# Patient Record
Sex: Female | Born: 1940 | Smoking: Never smoker
Health system: Southern US, Community
[De-identification: ages and names within clinical notes are randomized; demographics above are authoritative.]

## PROBLEM LIST (undated history)

## (undated) DIAGNOSIS — I729 Aneurysm of unspecified site: Secondary | ICD-10-CM

## (undated) DIAGNOSIS — I10 Essential (primary) hypertension: Secondary | ICD-10-CM

## (undated) DIAGNOSIS — I609 Nontraumatic subarachnoid hemorrhage, unspecified: Secondary | ICD-10-CM

## (undated) HISTORY — PX: TONSILLECTOMY: SUR1361

---

## 2015-09-10 DIAGNOSIS — K8020 Calculus of gallbladder without cholecystitis without obstruction: Secondary | ICD-10-CM | POA: Insufficient documentation

## 2017-11-24 DIAGNOSIS — E876 Hypokalemia: Secondary | ICD-10-CM | POA: Insufficient documentation

## 2017-11-24 DIAGNOSIS — I671 Cerebral aneurysm, nonruptured: Secondary | ICD-10-CM | POA: Insufficient documentation

## 2017-12-14 DIAGNOSIS — R0609 Other forms of dyspnea: Secondary | ICD-10-CM

## 2017-12-14 DIAGNOSIS — R5382 Chronic fatigue, unspecified: Secondary | ICD-10-CM | POA: Insufficient documentation

## 2017-12-14 DIAGNOSIS — R6889 Other general symptoms and signs: Secondary | ICD-10-CM | POA: Insufficient documentation

## 2018-04-02 DIAGNOSIS — F419 Anxiety disorder, unspecified: Secondary | ICD-10-CM | POA: Insufficient documentation

## 2018-04-02 DIAGNOSIS — M810 Age-related osteoporosis without current pathological fracture: Secondary | ICD-10-CM | POA: Insufficient documentation

## 2018-04-02 DIAGNOSIS — R413 Other amnesia: Secondary | ICD-10-CM | POA: Diagnosis not present

## 2018-04-02 DIAGNOSIS — H109 Unspecified conjunctivitis: Secondary | ICD-10-CM | POA: Diagnosis not present

## 2018-04-02 DIAGNOSIS — R319 Hematuria, unspecified: Secondary | ICD-10-CM | POA: Insufficient documentation

## 2018-04-02 DIAGNOSIS — I671 Cerebral aneurysm, nonruptured: Secondary | ICD-10-CM | POA: Diagnosis not present

## 2018-04-02 DIAGNOSIS — R7302 Impaired glucose tolerance (oral): Secondary | ICD-10-CM | POA: Diagnosis not present

## 2018-04-02 DIAGNOSIS — I10 Essential (primary) hypertension: Secondary | ICD-10-CM | POA: Insufficient documentation

## 2018-04-02 DIAGNOSIS — D519 Vitamin B12 deficiency anemia, unspecified: Secondary | ICD-10-CM | POA: Insufficient documentation

## 2018-04-08 DIAGNOSIS — D519 Vitamin B12 deficiency anemia, unspecified: Secondary | ICD-10-CM | POA: Diagnosis not present

## 2018-04-08 DIAGNOSIS — R7302 Impaired glucose tolerance (oral): Secondary | ICD-10-CM | POA: Diagnosis not present

## 2018-04-08 DIAGNOSIS — I10 Essential (primary) hypertension: Secondary | ICD-10-CM | POA: Diagnosis not present

## 2018-04-14 DIAGNOSIS — R5383 Other fatigue: Secondary | ICD-10-CM | POA: Diagnosis not present

## 2018-04-14 DIAGNOSIS — F329 Major depressive disorder, single episode, unspecified: Secondary | ICD-10-CM | POA: Diagnosis not present

## 2018-04-14 DIAGNOSIS — Z7409 Other reduced mobility: Secondary | ICD-10-CM | POA: Diagnosis not present

## 2018-04-14 DIAGNOSIS — H8113 Benign paroxysmal vertigo, bilateral: Secondary | ICD-10-CM | POA: Diagnosis not present

## 2018-04-14 DIAGNOSIS — R5381 Other malaise: Secondary | ICD-10-CM | POA: Diagnosis not present

## 2018-04-14 DIAGNOSIS — W19XXXA Unspecified fall, initial encounter: Secondary | ICD-10-CM | POA: Diagnosis not present

## 2018-04-14 DIAGNOSIS — Z789 Other specified health status: Secondary | ICD-10-CM | POA: Insufficient documentation

## 2018-04-14 DIAGNOSIS — R42 Dizziness and giddiness: Secondary | ICD-10-CM | POA: Diagnosis not present

## 2018-04-14 DIAGNOSIS — R6889 Other general symptoms and signs: Secondary | ICD-10-CM | POA: Diagnosis not present

## 2018-04-14 DIAGNOSIS — R5382 Chronic fatigue, unspecified: Secondary | ICD-10-CM | POA: Diagnosis not present

## 2018-04-30 DIAGNOSIS — Z79899 Other long term (current) drug therapy: Secondary | ICD-10-CM | POA: Diagnosis not present

## 2018-04-30 DIAGNOSIS — D519 Vitamin B12 deficiency anemia, unspecified: Secondary | ICD-10-CM | POA: Diagnosis not present

## 2018-04-30 DIAGNOSIS — Z823 Family history of stroke: Secondary | ICD-10-CM | POA: Diagnosis not present

## 2018-04-30 DIAGNOSIS — R5382 Chronic fatigue, unspecified: Secondary | ICD-10-CM | POA: Diagnosis not present

## 2018-04-30 DIAGNOSIS — I10 Essential (primary) hypertension: Secondary | ICD-10-CM | POA: Diagnosis not present

## 2018-04-30 DIAGNOSIS — M899 Disorder of bone, unspecified: Secondary | ICD-10-CM | POA: Diagnosis not present

## 2018-04-30 DIAGNOSIS — R4182 Altered mental status, unspecified: Secondary | ICD-10-CM | POA: Diagnosis not present

## 2018-04-30 DIAGNOSIS — I361 Nonrheumatic tricuspid (valve) insufficiency: Secondary | ICD-10-CM | POA: Diagnosis not present

## 2018-04-30 DIAGNOSIS — R9431 Abnormal electrocardiogram [ECG] [EKG]: Secondary | ICD-10-CM | POA: Diagnosis not present

## 2018-04-30 DIAGNOSIS — Z7982 Long term (current) use of aspirin: Secondary | ICD-10-CM | POA: Diagnosis not present

## 2018-04-30 DIAGNOSIS — W19XXXA Unspecified fall, initial encounter: Secondary | ICD-10-CM | POA: Diagnosis not present

## 2018-04-30 DIAGNOSIS — M25512 Pain in left shoulder: Secondary | ICD-10-CM | POA: Diagnosis not present

## 2018-04-30 DIAGNOSIS — Z888 Allergy status to other drugs, medicaments and biological substances status: Secondary | ICD-10-CM | POA: Diagnosis not present

## 2018-04-30 DIAGNOSIS — I671 Cerebral aneurysm, nonruptured: Secondary | ICD-10-CM | POA: Diagnosis not present

## 2018-04-30 DIAGNOSIS — E785 Hyperlipidemia, unspecified: Secondary | ICD-10-CM | POA: Diagnosis not present

## 2018-04-30 DIAGNOSIS — Z8249 Family history of ischemic heart disease and other diseases of the circulatory system: Secondary | ICD-10-CM | POA: Diagnosis not present

## 2018-04-30 DIAGNOSIS — R930 Abnormal findings on diagnostic imaging of skull and head, not elsewhere classified: Secondary | ICD-10-CM | POA: Diagnosis not present

## 2018-04-30 DIAGNOSIS — Y998 Other external cause status: Secondary | ICD-10-CM | POA: Diagnosis not present

## 2018-04-30 DIAGNOSIS — F410 Panic disorder [episodic paroxysmal anxiety] without agoraphobia: Secondary | ICD-10-CM | POA: Diagnosis not present

## 2018-04-30 DIAGNOSIS — R262 Difficulty in walking, not elsewhere classified: Secondary | ICD-10-CM | POA: Diagnosis not present

## 2018-04-30 DIAGNOSIS — I720 Aneurysm of carotid artery: Secondary | ICD-10-CM | POA: Diagnosis not present

## 2018-04-30 DIAGNOSIS — R200 Anesthesia of skin: Secondary | ICD-10-CM | POA: Diagnosis not present

## 2018-04-30 DIAGNOSIS — R6889 Other general symptoms and signs: Secondary | ICD-10-CM | POA: Diagnosis not present

## 2018-04-30 DIAGNOSIS — Z885 Allergy status to narcotic agent status: Secondary | ICD-10-CM | POA: Diagnosis not present

## 2018-04-30 DIAGNOSIS — R531 Weakness: Secondary | ICD-10-CM | POA: Diagnosis not present

## 2018-04-30 DIAGNOSIS — R29705 NIHSS score 5: Secondary | ICD-10-CM | POA: Diagnosis not present

## 2018-04-30 DIAGNOSIS — R937 Abnormal findings on diagnostic imaging of other parts of musculoskeletal system: Secondary | ICD-10-CM | POA: Diagnosis not present

## 2018-04-30 DIAGNOSIS — G8194 Hemiplegia, unspecified affecting left nondominant side: Secondary | ICD-10-CM | POA: Diagnosis not present

## 2018-04-30 DIAGNOSIS — E119 Type 2 diabetes mellitus without complications: Secondary | ICD-10-CM | POA: Diagnosis not present

## 2018-04-30 DIAGNOSIS — F419 Anxiety disorder, unspecified: Secondary | ICD-10-CM | POA: Diagnosis not present

## 2018-04-30 DIAGNOSIS — I440 Atrioventricular block, first degree: Secondary | ICD-10-CM | POA: Diagnosis not present

## 2018-04-30 DIAGNOSIS — I609 Nontraumatic subarachnoid hemorrhage, unspecified: Secondary | ICD-10-CM | POA: Diagnosis not present

## 2018-04-30 DIAGNOSIS — F329 Major depressive disorder, single episode, unspecified: Secondary | ICD-10-CM | POA: Diagnosis not present

## 2018-04-30 DIAGNOSIS — M6281 Muscle weakness (generalized): Secondary | ICD-10-CM | POA: Diagnosis not present

## 2018-04-30 DIAGNOSIS — R079 Chest pain, unspecified: Secondary | ICD-10-CM | POA: Diagnosis not present

## 2018-04-30 DIAGNOSIS — R202 Paresthesia of skin: Secondary | ICD-10-CM | POA: Diagnosis not present

## 2018-04-30 DIAGNOSIS — R410 Disorientation, unspecified: Secondary | ICD-10-CM | POA: Diagnosis not present

## 2018-05-06 ENCOUNTER — Other Ambulatory Visit: Payer: Self-pay

## 2018-05-06 NOTE — Patient Outreach (Signed)
[facility]) Care Management  05/06/2018  Jasmine Dickerson 1940-08-02 [id number]   Transition of care  Referral date: 05/05/18 Referral source: [facility] on 05/05/18 Insurance: Health team advantage Attempt #1  Telephone call to patient regarding referral. Unable to reach patient. HIPAA compliant voice message left with call back phone number.   PLAN: RNCM will attempt 2nd telephone call to patient within 4 business days. RNCM will send outreach letter.   George Ina RN,BSN, CCM [facility] Telephonic  [phone]

## 2018-05-08 DIAGNOSIS — F419 Anxiety disorder, unspecified: Secondary | ICD-10-CM | POA: Diagnosis not present

## 2018-05-08 DIAGNOSIS — I10 Essential (primary) hypertension: Secondary | ICD-10-CM | POA: Diagnosis not present

## 2018-05-08 DIAGNOSIS — R5382 Chronic fatigue, unspecified: Secondary | ICD-10-CM | POA: Diagnosis not present

## 2018-05-08 DIAGNOSIS — D519 Vitamin B12 deficiency anemia, unspecified: Secondary | ICD-10-CM | POA: Diagnosis not present

## 2018-05-08 DIAGNOSIS — I671 Cerebral aneurysm, nonruptured: Secondary | ICD-10-CM | POA: Diagnosis not present

## 2018-05-11 ENCOUNTER — Other Ambulatory Visit: Payer: Self-pay

## 2018-05-11 DIAGNOSIS — R29898 Other symptoms and signs involving the musculoskeletal system: Secondary | ICD-10-CM | POA: Diagnosis not present

## 2018-05-11 DIAGNOSIS — Z7982 Long term (current) use of aspirin: Secondary | ICD-10-CM | POA: Diagnosis not present

## 2018-05-11 DIAGNOSIS — R9401 Abnormal electroencephalogram [EEG]: Secondary | ICD-10-CM | POA: Diagnosis not present

## 2018-05-11 DIAGNOSIS — R5383 Other fatigue: Secondary | ICD-10-CM | POA: Diagnosis not present

## 2018-05-11 DIAGNOSIS — Z8679 Personal history of other diseases of the circulatory system: Secondary | ICD-10-CM | POA: Diagnosis not present

## 2018-05-11 DIAGNOSIS — R42 Dizziness and giddiness: Secondary | ICD-10-CM | POA: Diagnosis not present

## 2018-05-11 DIAGNOSIS — R404 Transient alteration of awareness: Secondary | ICD-10-CM | POA: Diagnosis not present

## 2018-05-11 DIAGNOSIS — Z79899 Other long term (current) drug therapy: Secondary | ICD-10-CM | POA: Diagnosis not present

## 2018-05-11 DIAGNOSIS — I10 Essential (primary) hypertension: Secondary | ICD-10-CM | POA: Diagnosis not present

## 2018-05-11 DIAGNOSIS — R569 Unspecified convulsions: Secondary | ICD-10-CM | POA: Diagnosis not present

## 2018-05-11 NOTE — Patient Outreach (Signed)
[facility]) Care Management  05/11/2018  Jasmine Dickerson 1940-09-04 [id number]  Transition of care  Referral date: 05/05/18 Referral source: [facility] on 05/05/18 Insurance: Health team advantage  Telephone call to patient regarding transition of care referral. HIPAA verified with patient. Patient gave verbal authorization to speak with her daughter, Jasmine Dickerson regarding her health information.  Both patient and daughter on call together with RNCM.  Daughter states patient was admitted to the hospital or possible stroke.   She states patient had MRI and was found to have an area on the right side of her brain.  Unsure of diagnosis at this time.  Daughter states patient is scheduled to see her primary MD on 05/12/18.  She states patient was advised to see a neurologist after discharge from the hospital. She states she has not heard from anyone regarding a neurology follow up so she will discuss this with patients primary MD on tomorrow. Daughter states patient is receiving services nursing and physical therapy services from [facility]. Daughter states patient is having dizziness on the left side of her body and is having dizziness. Daughter states she would like to talk with someone regarding in home care and possible qualifications for services.  RNCM discussed and offered [facility] care management services. Daughter agreed to social work services to follow up.  RNCM advised patient to notify MD of any changes in condition prior to scheduled appointment. RNCM provided contact name and number: [phone] or main office number [phone] and 24 hour nurse advise line [phone] by mail.  RNCM verified patient aware of 911 services for urgent/ emergent needs.  PLAN; RNCM will refer patient to social worker  George Ina RN,BSN,CCM [facility] Telephonic  [phone]

## 2018-05-12 DIAGNOSIS — F329 Major depressive disorder, single episode, unspecified: Secondary | ICD-10-CM | POA: Diagnosis not present

## 2018-05-12 DIAGNOSIS — Z8679 Personal history of other diseases of the circulatory system: Secondary | ICD-10-CM | POA: Diagnosis not present

## 2018-05-12 DIAGNOSIS — R569 Unspecified convulsions: Secondary | ICD-10-CM | POA: Diagnosis not present

## 2018-05-12 DIAGNOSIS — I10 Essential (primary) hypertension: Secondary | ICD-10-CM | POA: Diagnosis not present

## 2018-05-14 DIAGNOSIS — F419 Anxiety disorder, unspecified: Secondary | ICD-10-CM | POA: Diagnosis not present

## 2018-05-14 DIAGNOSIS — I671 Cerebral aneurysm, nonruptured: Secondary | ICD-10-CM | POA: Diagnosis not present

## 2018-05-14 DIAGNOSIS — R5382 Chronic fatigue, unspecified: Secondary | ICD-10-CM | POA: Diagnosis not present

## 2018-05-14 DIAGNOSIS — I10 Essential (primary) hypertension: Secondary | ICD-10-CM | POA: Diagnosis not present

## 2018-05-14 DIAGNOSIS — D519 Vitamin B12 deficiency anemia, unspecified: Secondary | ICD-10-CM | POA: Diagnosis not present

## 2018-05-17 DIAGNOSIS — R5382 Chronic fatigue, unspecified: Secondary | ICD-10-CM | POA: Diagnosis not present

## 2018-05-17 DIAGNOSIS — F419 Anxiety disorder, unspecified: Secondary | ICD-10-CM | POA: Diagnosis not present

## 2018-05-17 DIAGNOSIS — D519 Vitamin B12 deficiency anemia, unspecified: Secondary | ICD-10-CM | POA: Diagnosis not present

## 2018-05-17 DIAGNOSIS — I10 Essential (primary) hypertension: Secondary | ICD-10-CM | POA: Diagnosis not present

## 2018-05-17 DIAGNOSIS — I671 Cerebral aneurysm, nonruptured: Secondary | ICD-10-CM | POA: Diagnosis not present

## 2018-05-20 ENCOUNTER — Other Ambulatory Visit: Payer: Self-pay | Admitting: *Deleted

## 2018-05-20 ENCOUNTER — Encounter: Payer: Self-pay | Admitting: *Deleted

## 2018-05-20 NOTE — Patient Outreach (Signed)
[facility]) Care Management  05/20/2018  Jasmine Dickerson 12/08/1940 [id number]  CSW was able to make contact with patient and patient's daughter, Jasmine Dickerson today to perform the initial phone assessment on patient, as well as assess and assist with social work needs and services.  CSW introduced self, explained role and types of services provided through [facility] Care Management [facility] Care Management).  CSW further explained to patient and Mrs. Bethea that CSW works with patient's Telephonic RNCM, also with [facility] Care Management, George Ina. CSW then explained the reason for the call, indicating that Mrs. Chilton Si thought that patient would benefit from social work services and resources to assist with arranging in-home care services for patient.  CSW obtained two HIPAA compliant identifiers from patient as well as Mrs. Bethea, which included patient's name and date of birth.  CSW first inquired as to whether or not patient and Mrs. Bethea thought that patient would qualify for Adult Medicaid through the [facility].  Both denied, indicating that patient was recently evaluated by a home health social worker with [facility], who explained to patient and Mrs. Bethea that patient is over-qualified, due to the amount of Social Security Income that she currently receives.  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  CSW explained to patient and Mrs. Bethea that [employer] Advantage Medicare does not cover the cost of an in-home aide, unless there is also a skilled need being provided.  Mrs. Bethea reported that patient currently has a home health aide with [facility], in addition to a home health nurse and physical therapist, she was referring more toward long-term in-home care services, as she realizes that home health services with [facility] will be terminating soon.    CSW agreed to mail patient and Mrs. Bethea a list of in-home care  agencies, as well as a list of home health agencies, but they declined, admitting that they are unable to afford any added expenses each month, both being on very fixed incomes.  Mrs. Eduard Clos indicated that she is currently on [employer] with her job, staying with patient around the clock to provide 24 hour care and supervision, until they are able to determine what is wrong with patient.  Mrs. Bethea reported that patient recently underwent an MRI of her brain, which showed a suspicious area, that has yet to be diagnosed.  Mrs. Bethea agreed to contact CSW directly if anything changes or if additional social work services are needed in the near future.  CSW was able to confirm with Mrs. Bethea that she has the correct contact information for CSW.  Danford Bad, BSW, MSW, LCSW  Licensed Restaurant manager, [employer]  Mailing [place] [address], [place], [place] [address] Physical Address-[address], [place], [place] [address] Toll Free Main # [phone] Fax # [phone] Cell # [phone]  Office # [phone] [email or web address]

## 2018-05-21 DIAGNOSIS — R404 Transient alteration of awareness: Secondary | ICD-10-CM | POA: Diagnosis not present

## 2018-05-21 DIAGNOSIS — R410 Disorientation, unspecified: Secondary | ICD-10-CM | POA: Diagnosis not present

## 2018-05-21 DIAGNOSIS — R55 Syncope and collapse: Secondary | ICD-10-CM | POA: Diagnosis not present

## 2018-05-21 DIAGNOSIS — R001 Bradycardia, unspecified: Secondary | ICD-10-CM | POA: Diagnosis not present

## 2018-05-21 DIAGNOSIS — G459 Transient cerebral ischemic attack, unspecified: Secondary | ICD-10-CM | POA: Diagnosis not present

## 2018-05-21 DIAGNOSIS — I10 Essential (primary) hypertension: Secondary | ICD-10-CM | POA: Diagnosis not present

## 2018-05-23 DIAGNOSIS — R001 Bradycardia, unspecified: Secondary | ICD-10-CM | POA: Diagnosis not present

## 2018-05-26 DIAGNOSIS — R5382 Chronic fatigue, unspecified: Secondary | ICD-10-CM | POA: Diagnosis not present

## 2018-05-26 DIAGNOSIS — I10 Essential (primary) hypertension: Secondary | ICD-10-CM | POA: Diagnosis not present

## 2018-05-26 DIAGNOSIS — E876 Hypokalemia: Secondary | ICD-10-CM | POA: Diagnosis not present

## 2018-05-26 DIAGNOSIS — R531 Weakness: Secondary | ICD-10-CM | POA: Diagnosis not present

## 2018-05-26 DIAGNOSIS — F329 Major depressive disorder, single episode, unspecified: Secondary | ICD-10-CM | POA: Diagnosis not present

## 2018-05-28 DIAGNOSIS — F419 Anxiety disorder, unspecified: Secondary | ICD-10-CM | POA: Diagnosis not present

## 2018-05-28 DIAGNOSIS — I671 Cerebral aneurysm, nonruptured: Secondary | ICD-10-CM | POA: Diagnosis not present

## 2018-05-28 DIAGNOSIS — R5382 Chronic fatigue, unspecified: Secondary | ICD-10-CM | POA: Diagnosis not present

## 2018-05-28 DIAGNOSIS — I10 Essential (primary) hypertension: Secondary | ICD-10-CM | POA: Diagnosis not present

## 2018-05-28 DIAGNOSIS — D519 Vitamin B12 deficiency anemia, unspecified: Secondary | ICD-10-CM | POA: Diagnosis not present

## 2018-06-01 DIAGNOSIS — I440 Atrioventricular block, first degree: Secondary | ICD-10-CM | POA: Diagnosis not present

## 2018-06-16 DIAGNOSIS — I10 Essential (primary) hypertension: Secondary | ICD-10-CM | POA: Diagnosis not present

## 2018-06-16 DIAGNOSIS — I728 Aneurysm of other specified arteries: Secondary | ICD-10-CM | POA: Diagnosis not present

## 2018-06-16 DIAGNOSIS — R569 Unspecified convulsions: Secondary | ICD-10-CM | POA: Diagnosis not present

## 2018-06-16 DIAGNOSIS — F329 Major depressive disorder, single episode, unspecified: Secondary | ICD-10-CM | POA: Diagnosis not present

## 2018-06-16 DIAGNOSIS — I160 Hypertensive urgency: Secondary | ICD-10-CM | POA: Insufficient documentation

## 2018-06-16 DIAGNOSIS — I609 Nontraumatic subarachnoid hemorrhage, unspecified: Secondary | ICD-10-CM | POA: Diagnosis not present

## 2018-06-17 DIAGNOSIS — F419 Anxiety disorder, unspecified: Secondary | ICD-10-CM | POA: Diagnosis not present

## 2018-06-17 DIAGNOSIS — D519 Vitamin B12 deficiency anemia, unspecified: Secondary | ICD-10-CM | POA: Diagnosis not present

## 2018-06-17 DIAGNOSIS — R5382 Chronic fatigue, unspecified: Secondary | ICD-10-CM | POA: Diagnosis not present

## 2018-06-17 DIAGNOSIS — I10 Essential (primary) hypertension: Secondary | ICD-10-CM | POA: Diagnosis not present

## 2018-06-17 DIAGNOSIS — I671 Cerebral aneurysm, nonruptured: Secondary | ICD-10-CM | POA: Diagnosis not present

## 2018-06-18 DIAGNOSIS — Z79899 Other long term (current) drug therapy: Secondary | ICD-10-CM | POA: Diagnosis not present

## 2018-06-18 DIAGNOSIS — I10 Essential (primary) hypertension: Secondary | ICD-10-CM | POA: Diagnosis not present

## 2018-06-18 DIAGNOSIS — F329 Major depressive disorder, single episode, unspecified: Secondary | ICD-10-CM | POA: Diagnosis not present

## 2018-06-18 DIAGNOSIS — I609 Nontraumatic subarachnoid hemorrhage, unspecified: Secondary | ICD-10-CM | POA: Diagnosis not present

## 2018-06-21 DIAGNOSIS — I609 Nontraumatic subarachnoid hemorrhage, unspecified: Secondary | ICD-10-CM | POA: Diagnosis not present

## 2018-06-21 DIAGNOSIS — F329 Major depressive disorder, single episode, unspecified: Secondary | ICD-10-CM | POA: Diagnosis not present

## 2018-06-21 DIAGNOSIS — I10 Essential (primary) hypertension: Secondary | ICD-10-CM | POA: Diagnosis not present

## 2018-06-24 DIAGNOSIS — I10 Essential (primary) hypertension: Secondary | ICD-10-CM | POA: Diagnosis not present

## 2018-06-24 DIAGNOSIS — R51 Headache: Secondary | ICD-10-CM | POA: Diagnosis not present

## 2018-06-24 DIAGNOSIS — I609 Nontraumatic subarachnoid hemorrhage, unspecified: Secondary | ICD-10-CM | POA: Diagnosis not present

## 2018-07-01 DIAGNOSIS — I10 Essential (primary) hypertension: Secondary | ICD-10-CM | POA: Diagnosis not present

## 2018-07-02 DIAGNOSIS — R51 Headache: Secondary | ICD-10-CM | POA: Diagnosis not present

## 2018-07-02 DIAGNOSIS — F329 Major depressive disorder, single episode, unspecified: Secondary | ICD-10-CM | POA: Diagnosis not present

## 2018-07-02 DIAGNOSIS — F330 Major depressive disorder, recurrent, mild: Secondary | ICD-10-CM | POA: Diagnosis not present

## 2018-07-02 DIAGNOSIS — R5382 Chronic fatigue, unspecified: Secondary | ICD-10-CM | POA: Diagnosis not present

## 2018-07-02 DIAGNOSIS — I10 Essential (primary) hypertension: Secondary | ICD-10-CM | POA: Diagnosis not present

## 2018-07-14 ENCOUNTER — Other Ambulatory Visit: Payer: Self-pay

## 2018-07-22 ENCOUNTER — Ambulatory Visit: Payer: PPO

## 2018-07-29 ENCOUNTER — Encounter: Payer: Self-pay | Admitting: *Deleted

## 2018-08-16 ENCOUNTER — Other Ambulatory Visit: Payer: Self-pay | Admitting: *Deleted

## 2018-08-16 NOTE — Patient Outreach (Signed)
[facility]) Care Management  08/16/2018  RETTA SALIBA 09-06-1940 [id number]  Follow up from [facility] HRA screening phone call placed to patient June 15, 2018; HIPAA/ identity verified with patient's daughter/ caregiver Glorious Peach.  Successful outreach for screening completed with patient's caregiver/ daughter.  Daughter declined ongoing [facility] CM outreach, stating no needs; daughter stated patient had previously worked with [facility] CM and had no ongoing needs; daughter stated she had contact information for [facility] CM and declined receiving another Careers adviser.  No concerns/ issues or needs were identified during screening phone call today.  Plan:  Patient to be followed in HRA engaged program  Verified on June 15, 2018 that patient had been mailed successful patient outreach letter on 05/11/2018  Caryl Pina, RN, BSN, CCRN [facility] Coordinator [facility] Care Management  [phone]

## 2018-08-27 DIAGNOSIS — H0102B Squamous blepharitis left eye, upper and lower eyelids: Secondary | ICD-10-CM | POA: Diagnosis not present

## 2018-08-27 DIAGNOSIS — H0102A Squamous blepharitis right eye, upper and lower eyelids: Secondary | ICD-10-CM | POA: Diagnosis not present

## 2018-08-27 DIAGNOSIS — H11442 Conjunctival cysts, left eye: Secondary | ICD-10-CM | POA: Diagnosis not present

## 2018-08-27 DIAGNOSIS — H04123 Dry eye syndrome of bilateral lacrimal glands: Secondary | ICD-10-CM | POA: Diagnosis not present

## 2018-08-27 DIAGNOSIS — H02883 Meibomian gland dysfunction of right eye, unspecified eyelid: Secondary | ICD-10-CM | POA: Diagnosis not present

## 2018-08-27 DIAGNOSIS — H11153 Pinguecula, bilateral: Secondary | ICD-10-CM | POA: Diagnosis not present

## 2018-08-27 DIAGNOSIS — H25813 Combined forms of age-related cataract, bilateral: Secondary | ICD-10-CM | POA: Diagnosis not present

## 2018-08-27 DIAGNOSIS — Z79899 Other long term (current) drug therapy: Secondary | ICD-10-CM | POA: Diagnosis not present

## 2018-08-27 DIAGNOSIS — H43813 Vitreous degeneration, bilateral: Secondary | ICD-10-CM | POA: Diagnosis not present

## 2018-08-27 DIAGNOSIS — H02886 Meibomian gland dysfunction of left eye, unspecified eyelid: Secondary | ICD-10-CM | POA: Diagnosis not present

## 2018-08-31 ENCOUNTER — Other Ambulatory Visit: Payer: Self-pay | Admitting: *Deleted

## 2018-08-31 NOTE — Patient Outreach (Signed)
[facility]) Care Management [facility] CM Case Closure note Patient active with external program 08/31/2018  Jasmine Dickerson [birth year] [id number]  [facility] Care Management Case Closure note from previously placed [facility] HRA screening phone call  Received confirmation from [facility] team that patient is now active with an external program  Plan:  Will close patient case and make patient inactive with [facility] CM  Oneta Rack, RN, BSN, [place] Coordinator [facility] Care Management  [phone]

## 2018-09-28 DIAGNOSIS — F0391 Unspecified dementia with behavioral disturbance: Secondary | ICD-10-CM | POA: Diagnosis not present

## 2018-09-28 DIAGNOSIS — I10 Essential (primary) hypertension: Secondary | ICD-10-CM | POA: Diagnosis not present

## 2018-09-28 DIAGNOSIS — R5382 Chronic fatigue, unspecified: Secondary | ICD-10-CM | POA: Diagnosis not present

## 2018-09-28 DIAGNOSIS — Z8679 Personal history of other diseases of the circulatory system: Secondary | ICD-10-CM | POA: Diagnosis not present

## 2018-09-28 DIAGNOSIS — R413 Other amnesia: Secondary | ICD-10-CM | POA: Diagnosis not present

## 2018-09-28 DIAGNOSIS — I728 Aneurysm of other specified arteries: Secondary | ICD-10-CM | POA: Diagnosis not present

## 2018-10-07 DIAGNOSIS — J329 Chronic sinusitis, unspecified: Secondary | ICD-10-CM | POA: Diagnosis not present

## 2018-10-07 DIAGNOSIS — R0602 Shortness of breath: Secondary | ICD-10-CM | POA: Diagnosis not present

## 2018-10-07 DIAGNOSIS — Z20828 Contact with and (suspected) exposure to other viral communicable diseases: Secondary | ICD-10-CM | POA: Diagnosis not present

## 2018-10-07 DIAGNOSIS — R05 Cough: Secondary | ICD-10-CM | POA: Diagnosis not present

## 2018-11-02 DIAGNOSIS — Z1239 Encounter for other screening for malignant neoplasm of breast: Secondary | ICD-10-CM | POA: Diagnosis not present

## 2018-11-02 DIAGNOSIS — R2689 Other abnormalities of gait and mobility: Secondary | ICD-10-CM | POA: Diagnosis not present

## 2018-11-02 DIAGNOSIS — R296 Repeated falls: Secondary | ICD-10-CM | POA: Diagnosis not present

## 2018-11-02 DIAGNOSIS — I10 Essential (primary) hypertension: Secondary | ICD-10-CM | POA: Diagnosis not present

## 2018-11-02 DIAGNOSIS — F419 Anxiety disorder, unspecified: Secondary | ICD-10-CM | POA: Diagnosis not present

## 2018-11-02 DIAGNOSIS — Z789 Other specified health status: Secondary | ICD-10-CM | POA: Diagnosis not present

## 2018-11-02 DIAGNOSIS — R5381 Other malaise: Secondary | ICD-10-CM | POA: Diagnosis not present

## 2018-11-02 DIAGNOSIS — Z7409 Other reduced mobility: Secondary | ICD-10-CM | POA: Diagnosis not present

## 2018-11-08 DIAGNOSIS — I10 Essential (primary) hypertension: Secondary | ICD-10-CM | POA: Diagnosis not present

## 2018-11-08 DIAGNOSIS — Z8673 Personal history of transient ischemic attack (TIA), and cerebral infarction without residual deficits: Secondary | ICD-10-CM | POA: Diagnosis not present

## 2018-11-08 DIAGNOSIS — R600 Localized edema: Secondary | ICD-10-CM | POA: Diagnosis not present

## 2018-11-08 DIAGNOSIS — F419 Anxiety disorder, unspecified: Secondary | ICD-10-CM | POA: Diagnosis not present

## 2018-11-08 DIAGNOSIS — D510 Vitamin B12 deficiency anemia due to intrinsic factor deficiency: Secondary | ICD-10-CM | POA: Diagnosis not present

## 2018-11-08 DIAGNOSIS — Z9181 History of falling: Secondary | ICD-10-CM | POA: Diagnosis not present

## 2018-11-08 DIAGNOSIS — L905 Scar conditions and fibrosis of skin: Secondary | ICD-10-CM | POA: Diagnosis not present

## 2018-11-08 DIAGNOSIS — M810 Age-related osteoporosis without current pathological fracture: Secondary | ICD-10-CM | POA: Diagnosis not present

## 2018-11-08 DIAGNOSIS — F329 Major depressive disorder, single episode, unspecified: Secondary | ICD-10-CM | POA: Diagnosis not present

## 2018-11-18 DIAGNOSIS — F419 Anxiety disorder, unspecified: Secondary | ICD-10-CM | POA: Diagnosis not present

## 2018-11-18 DIAGNOSIS — D510 Vitamin B12 deficiency anemia due to intrinsic factor deficiency: Secondary | ICD-10-CM | POA: Diagnosis not present

## 2018-11-18 DIAGNOSIS — L905 Scar conditions and fibrosis of skin: Secondary | ICD-10-CM | POA: Diagnosis not present

## 2018-11-18 DIAGNOSIS — F329 Major depressive disorder, single episode, unspecified: Secondary | ICD-10-CM | POA: Diagnosis not present

## 2018-11-18 DIAGNOSIS — I10 Essential (primary) hypertension: Secondary | ICD-10-CM | POA: Diagnosis not present

## 2018-11-18 DIAGNOSIS — Z9181 History of falling: Secondary | ICD-10-CM | POA: Diagnosis not present

## 2018-11-18 DIAGNOSIS — R600 Localized edema: Secondary | ICD-10-CM | POA: Diagnosis not present

## 2018-11-18 DIAGNOSIS — M810 Age-related osteoporosis without current pathological fracture: Secondary | ICD-10-CM | POA: Diagnosis not present

## 2018-11-18 DIAGNOSIS — Z8673 Personal history of transient ischemic attack (TIA), and cerebral infarction without residual deficits: Secondary | ICD-10-CM | POA: Diagnosis not present

## 2018-12-03 DIAGNOSIS — M810 Age-related osteoporosis without current pathological fracture: Secondary | ICD-10-CM | POA: Diagnosis not present

## 2018-12-03 DIAGNOSIS — Z9181 History of falling: Secondary | ICD-10-CM | POA: Diagnosis not present

## 2018-12-03 DIAGNOSIS — F419 Anxiety disorder, unspecified: Secondary | ICD-10-CM | POA: Diagnosis not present

## 2018-12-03 DIAGNOSIS — D510 Vitamin B12 deficiency anemia due to intrinsic factor deficiency: Secondary | ICD-10-CM | POA: Diagnosis not present

## 2018-12-03 DIAGNOSIS — F329 Major depressive disorder, single episode, unspecified: Secondary | ICD-10-CM | POA: Diagnosis not present

## 2018-12-03 DIAGNOSIS — R600 Localized edema: Secondary | ICD-10-CM | POA: Diagnosis not present

## 2018-12-03 DIAGNOSIS — L905 Scar conditions and fibrosis of skin: Secondary | ICD-10-CM | POA: Diagnosis not present

## 2018-12-03 DIAGNOSIS — Z8673 Personal history of transient ischemic attack (TIA), and cerebral infarction without residual deficits: Secondary | ICD-10-CM | POA: Diagnosis not present

## 2018-12-03 DIAGNOSIS — I10 Essential (primary) hypertension: Secondary | ICD-10-CM | POA: Diagnosis not present

## 2019-01-26 DIAGNOSIS — R319 Hematuria, unspecified: Secondary | ICD-10-CM | POA: Diagnosis not present

## 2019-05-06 DIAGNOSIS — I10 Essential (primary) hypertension: Secondary | ICD-10-CM | POA: Diagnosis not present

## 2019-05-06 DIAGNOSIS — Z13220 Encounter for screening for lipoid disorders: Secondary | ICD-10-CM | POA: Diagnosis not present

## 2019-05-06 DIAGNOSIS — Z0000 Encounter for general adult medical examination without abnormal findings: Secondary | ICD-10-CM | POA: Diagnosis not present

## 2019-05-06 DIAGNOSIS — R358 Other polyuria: Secondary | ICD-10-CM | POA: Diagnosis not present

## 2019-05-06 DIAGNOSIS — F330 Major depressive disorder, recurrent, mild: Secondary | ICD-10-CM | POA: Diagnosis not present

## 2019-05-06 DIAGNOSIS — I609 Nontraumatic subarachnoid hemorrhage, unspecified: Secondary | ICD-10-CM | POA: Diagnosis not present

## 2019-05-06 DIAGNOSIS — F329 Major depressive disorder, single episode, unspecified: Secondary | ICD-10-CM | POA: Diagnosis not present

## 2019-05-06 DIAGNOSIS — F419 Anxiety disorder, unspecified: Secondary | ICD-10-CM | POA: Diagnosis not present

## 2019-05-06 DIAGNOSIS — I671 Cerebral aneurysm, nonruptured: Secondary | ICD-10-CM | POA: Diagnosis not present

## 2019-05-11 DIAGNOSIS — I10 Essential (primary) hypertension: Secondary | ICD-10-CM | POA: Diagnosis not present

## 2019-05-11 DIAGNOSIS — Z13220 Encounter for screening for lipoid disorders: Secondary | ICD-10-CM | POA: Diagnosis not present

## 2019-05-11 DIAGNOSIS — I671 Cerebral aneurysm, nonruptured: Secondary | ICD-10-CM | POA: Diagnosis not present

## 2019-06-21 DIAGNOSIS — M1990 Unspecified osteoarthritis, unspecified site: Secondary | ICD-10-CM | POA: Diagnosis not present

## 2019-06-21 DIAGNOSIS — R5381 Other malaise: Secondary | ICD-10-CM | POA: Diagnosis not present

## 2019-06-21 DIAGNOSIS — Z87820 Personal history of traumatic brain injury: Secondary | ICD-10-CM | POA: Diagnosis not present

## 2019-06-21 DIAGNOSIS — R0600 Dyspnea, unspecified: Secondary | ICD-10-CM | POA: Diagnosis not present

## 2019-06-21 DIAGNOSIS — Z8679 Personal history of other diseases of the circulatory system: Secondary | ICD-10-CM | POA: Diagnosis not present

## 2019-06-21 DIAGNOSIS — Z7409 Other reduced mobility: Secondary | ICD-10-CM | POA: Diagnosis not present

## 2019-06-21 DIAGNOSIS — R55 Syncope and collapse: Secondary | ICD-10-CM | POA: Diagnosis not present

## 2019-06-21 DIAGNOSIS — R531 Weakness: Secondary | ICD-10-CM | POA: Diagnosis not present

## 2019-06-21 DIAGNOSIS — R457 State of emotional shock and stress, unspecified: Secondary | ICD-10-CM | POA: Diagnosis not present

## 2019-06-21 DIAGNOSIS — R5383 Other fatigue: Secondary | ICD-10-CM | POA: Diagnosis not present

## 2019-06-21 DIAGNOSIS — M25561 Pain in right knee: Secondary | ICD-10-CM | POA: Diagnosis not present

## 2019-06-21 DIAGNOSIS — Z79899 Other long term (current) drug therapy: Secondary | ICD-10-CM | POA: Diagnosis not present

## 2019-06-21 DIAGNOSIS — R42 Dizziness and giddiness: Secondary | ICD-10-CM | POA: Diagnosis not present

## 2019-06-21 DIAGNOSIS — I10 Essential (primary) hypertension: Secondary | ICD-10-CM | POA: Diagnosis not present

## 2019-06-21 DIAGNOSIS — R519 Headache, unspecified: Secondary | ICD-10-CM | POA: Diagnosis not present

## 2019-06-21 DIAGNOSIS — M25512 Pain in left shoulder: Secondary | ICD-10-CM | POA: Diagnosis not present

## 2019-06-21 DIAGNOSIS — W19XXXA Unspecified fall, initial encounter: Secondary | ICD-10-CM | POA: Diagnosis not present

## 2019-06-21 DIAGNOSIS — M25551 Pain in right hip: Secondary | ICD-10-CM | POA: Diagnosis not present

## 2019-06-21 DIAGNOSIS — R208 Other disturbances of skin sensation: Secondary | ICD-10-CM | POA: Diagnosis not present

## 2019-06-21 DIAGNOSIS — Y998 Other external cause status: Secondary | ICD-10-CM | POA: Diagnosis not present

## 2019-06-21 DIAGNOSIS — M1611 Unilateral primary osteoarthritis, right hip: Secondary | ICD-10-CM | POA: Diagnosis not present

## 2019-06-21 DIAGNOSIS — Z7982 Long term (current) use of aspirin: Secondary | ICD-10-CM | POA: Diagnosis not present

## 2019-06-21 DIAGNOSIS — I671 Cerebral aneurysm, nonruptured: Secondary | ICD-10-CM | POA: Diagnosis not present

## 2019-06-21 DIAGNOSIS — R400 Somnolence: Secondary | ICD-10-CM | POA: Diagnosis not present

## 2019-06-21 DIAGNOSIS — W1839XA Other fall on same level, initial encounter: Secondary | ICD-10-CM | POA: Diagnosis not present

## 2019-06-22 DIAGNOSIS — R001 Bradycardia, unspecified: Secondary | ICD-10-CM | POA: Diagnosis not present

## 2019-06-22 DIAGNOSIS — W1839XA Other fall on same level, initial encounter: Secondary | ICD-10-CM | POA: Diagnosis not present

## 2019-06-22 DIAGNOSIS — Y998 Other external cause status: Secondary | ICD-10-CM | POA: Diagnosis not present

## 2019-06-22 DIAGNOSIS — R531 Weakness: Secondary | ICD-10-CM | POA: Diagnosis not present

## 2019-06-22 DIAGNOSIS — R55 Syncope and collapse: Secondary | ICD-10-CM | POA: Diagnosis not present

## 2019-06-22 DIAGNOSIS — M25561 Pain in right knee: Secondary | ICD-10-CM | POA: Diagnosis not present

## 2019-06-22 DIAGNOSIS — M25551 Pain in right hip: Secondary | ICD-10-CM | POA: Diagnosis not present

## 2019-06-22 DIAGNOSIS — R9431 Abnormal electrocardiogram [ECG] [EKG]: Secondary | ICD-10-CM | POA: Diagnosis not present

## 2019-11-04 DIAGNOSIS — F419 Anxiety disorder, unspecified: Secondary | ICD-10-CM | POA: Diagnosis not present

## 2019-11-04 DIAGNOSIS — I10 Essential (primary) hypertension: Secondary | ICD-10-CM | POA: Diagnosis not present

## 2019-11-04 DIAGNOSIS — F329 Major depressive disorder, single episode, unspecified: Secondary | ICD-10-CM | POA: Diagnosis not present

## 2020-02-09 DIAGNOSIS — Z79899 Other long term (current) drug therapy: Secondary | ICD-10-CM | POA: Diagnosis not present

## 2020-02-09 DIAGNOSIS — I8002 Phlebitis and thrombophlebitis of superficial vessels of left lower extremity: Secondary | ICD-10-CM | POA: Diagnosis not present

## 2020-02-09 DIAGNOSIS — Z888 Allergy status to other drugs, medicaments and biological substances status: Secondary | ICD-10-CM | POA: Diagnosis not present

## 2020-02-09 DIAGNOSIS — M79605 Pain in left leg: Secondary | ICD-10-CM | POA: Diagnosis not present

## 2020-02-09 DIAGNOSIS — Z7982 Long term (current) use of aspirin: Secondary | ICD-10-CM | POA: Diagnosis not present

## 2020-02-09 DIAGNOSIS — Z885 Allergy status to narcotic agent status: Secondary | ICD-10-CM | POA: Diagnosis not present

## 2020-02-14 DIAGNOSIS — Z789 Other specified health status: Secondary | ICD-10-CM | POA: Diagnosis not present

## 2020-02-14 DIAGNOSIS — R413 Other amnesia: Secondary | ICD-10-CM | POA: Diagnosis not present

## 2020-02-14 DIAGNOSIS — Y92009 Unspecified place in unspecified non-institutional (private) residence as the place of occurrence of the external cause: Secondary | ICD-10-CM | POA: Diagnosis not present

## 2020-02-14 DIAGNOSIS — W19XXXA Unspecified fall, initial encounter: Secondary | ICD-10-CM | POA: Diagnosis not present

## 2020-02-14 DIAGNOSIS — Z7409 Other reduced mobility: Secondary | ICD-10-CM | POA: Diagnosis not present

## 2020-02-14 DIAGNOSIS — I8002 Phlebitis and thrombophlebitis of superficial vessels of left lower extremity: Secondary | ICD-10-CM | POA: Diagnosis not present

## 2020-02-14 DIAGNOSIS — I10 Essential (primary) hypertension: Secondary | ICD-10-CM | POA: Diagnosis not present

## 2020-05-02 DIAGNOSIS — R197 Diarrhea, unspecified: Secondary | ICD-10-CM | POA: Diagnosis not present

## 2020-05-02 DIAGNOSIS — R112 Nausea with vomiting, unspecified: Secondary | ICD-10-CM | POA: Diagnosis not present

## 2020-05-03 DIAGNOSIS — R197 Diarrhea, unspecified: Secondary | ICD-10-CM | POA: Diagnosis not present

## 2020-05-25 DIAGNOSIS — I671 Cerebral aneurysm, nonruptured: Secondary | ICD-10-CM | POA: Diagnosis not present

## 2020-07-04 DIAGNOSIS — R058 Other specified cough: Secondary | ICD-10-CM | POA: Diagnosis not present

## 2020-07-05 DIAGNOSIS — R058 Other specified cough: Secondary | ICD-10-CM | POA: Diagnosis not present

## 2020-10-30 DIAGNOSIS — J329 Chronic sinusitis, unspecified: Secondary | ICD-10-CM | POA: Diagnosis not present

## 2020-10-30 DIAGNOSIS — J40 Bronchitis, not specified as acute or chronic: Secondary | ICD-10-CM | POA: Diagnosis not present

## 2020-10-30 DIAGNOSIS — B9689 Other specified bacterial agents as the cause of diseases classified elsewhere: Secondary | ICD-10-CM | POA: Diagnosis not present

## 2020-12-21 DIAGNOSIS — M549 Dorsalgia, unspecified: Secondary | ICD-10-CM | POA: Diagnosis not present

## 2020-12-21 DIAGNOSIS — R0602 Shortness of breath: Secondary | ICD-10-CM | POA: Diagnosis not present

## 2020-12-21 DIAGNOSIS — R058 Other specified cough: Secondary | ICD-10-CM | POA: Diagnosis not present

## 2020-12-21 DIAGNOSIS — J40 Bronchitis, not specified as acute or chronic: Secondary | ICD-10-CM | POA: Diagnosis not present

## 2021-01-22 DIAGNOSIS — H01005 Unspecified blepharitis left lower eyelid: Secondary | ICD-10-CM | POA: Diagnosis not present

## 2021-01-22 DIAGNOSIS — H02885 Meibomian gland dysfunction left lower eyelid: Secondary | ICD-10-CM | POA: Diagnosis not present

## 2021-01-22 DIAGNOSIS — H01002 Unspecified blepharitis right lower eyelid: Secondary | ICD-10-CM | POA: Diagnosis not present

## 2021-01-22 DIAGNOSIS — H25813 Combined forms of age-related cataract, bilateral: Secondary | ICD-10-CM | POA: Diagnosis not present

## 2021-01-22 DIAGNOSIS — H0288B Meibomian gland dysfunction left eye, upper and lower eyelids: Secondary | ICD-10-CM | POA: Diagnosis not present

## 2021-01-22 DIAGNOSIS — H0100A Unspecified blepharitis right eye, upper and lower eyelids: Secondary | ICD-10-CM | POA: Diagnosis not present

## 2021-01-22 DIAGNOSIS — H0100B Unspecified blepharitis left eye, upper and lower eyelids: Secondary | ICD-10-CM | POA: Diagnosis not present

## 2021-01-22 DIAGNOSIS — H11153 Pinguecula, bilateral: Secondary | ICD-10-CM | POA: Diagnosis not present

## 2021-01-22 DIAGNOSIS — H0288A Meibomian gland dysfunction right eye, upper and lower eyelids: Secondary | ICD-10-CM | POA: Diagnosis not present

## 2021-01-22 DIAGNOSIS — H43813 Vitreous degeneration, bilateral: Secondary | ICD-10-CM | POA: Diagnosis not present

## 2021-01-22 DIAGNOSIS — H04123 Dry eye syndrome of bilateral lacrimal glands: Secondary | ICD-10-CM | POA: Diagnosis not present

## 2021-01-22 DIAGNOSIS — Z79899 Other long term (current) drug therapy: Secondary | ICD-10-CM | POA: Diagnosis not present

## 2021-01-22 DIAGNOSIS — H02882 Meibomian gland dysfunction right lower eyelid: Secondary | ICD-10-CM | POA: Diagnosis not present

## 2021-04-03 ENCOUNTER — Other Ambulatory Visit: Payer: Self-pay

## 2021-04-03 ENCOUNTER — Emergency Department (HOSPITAL_COMMUNITY): Payer: PPO

## 2021-04-03 ENCOUNTER — Emergency Department (HOSPITAL_COMMUNITY)
Admission: EM | Admit: 2021-04-03 | Discharge: 2021-04-03 | Disposition: A | Discharge status: Home / Self Care | Payer: PPO | Attending: Emergency Medicine | Admitting: Emergency Medicine

## 2021-04-03 ENCOUNTER — Encounter (HOSPITAL_COMMUNITY): Payer: Self-pay | Admitting: Oncology

## 2021-04-03 DIAGNOSIS — Z79899 Other long term (current) drug therapy: Secondary | ICD-10-CM | POA: Insufficient documentation

## 2021-04-03 DIAGNOSIS — Z7982 Long term (current) use of aspirin: Secondary | ICD-10-CM | POA: Diagnosis not present

## 2021-04-03 DIAGNOSIS — R55 Syncope and collapse: Secondary | ICD-10-CM | POA: Diagnosis present

## 2021-04-03 HISTORY — DX: Essential (primary) hypertension: I10

## 2021-04-03 HISTORY — DX: Aneurysm of unspecified site: I72.9

## 2021-04-03 HISTORY — DX: Nontraumatic subarachnoid hemorrhage, unspecified: I60.9

## 2021-04-03 LAB — CBC WITH DIFFERENTIAL/PLATELET
Abs Immature Granulocytes: 0 10*3/uL (ref 0.00–0.07)
Basophils Absolute: 0 10*3/uL (ref 0.0–0.1)
Basophils Relative: 1 %
Eosinophils Absolute: 0.1 10*3/uL (ref 0.0–0.5)
Eosinophils Relative: 1 %
HCT: 37.9 % (ref 36.0–46.0)
Hemoglobin: 12.3 g/dL (ref 12.0–15.0)
Immature Granulocytes: 0 %
Lymphocytes Relative: 35 %
Lymphs Abs: 1.9 10*3/uL (ref 0.7–4.0)
MCH: 27.4 pg (ref 26.0–34.0)
MCHC: 32.5 g/dL (ref 30.0–36.0)
MCV: 84.4 fL (ref 80.0–100.0)
Monocytes Absolute: 0.4 10*3/uL (ref 0.1–1.0)
Monocytes Relative: 7 %
Neutro Abs: 3 10*3/uL (ref 1.7–7.7)
Neutrophils Relative %: 56 %
Platelets: 224 10*3/uL (ref 150–400)
RBC: 4.49 MIL/uL (ref 3.87–5.11)
RDW: 14.5 % (ref 11.5–15.5)
WBC: 5.4 10*3/uL (ref 4.0–10.5)
nRBC: 0 % (ref 0.0–0.2)

## 2021-04-03 LAB — BASIC METABOLIC PANEL
Anion gap: 9 (ref 5–15)
BUN: 16 mg/dL (ref 8–23)
CO2: 25 mmol/L (ref 22–32)
Calcium: 9 mg/dL (ref 8.9–10.3)
Chloride: 107 mmol/L (ref 98–111)
Creatinine, Ser: 0.65 mg/dL (ref 0.44–1.00)
GFR, Estimated: >60 mL/min (ref >60)
Glucose, Bld: 100 mg/dL — ABNORMAL HIGH (ref 70–99)
Potassium: 3.1 mmol/L — ABNORMAL LOW (ref 3.5–5.1)
Sodium: 141 mmol/L (ref 135–145)

## 2021-04-03 MED ORDER — POTASSIUM CHLORIDE CRYS ER 20 MEQ PO TBCR
40.0000 meq | EXTENDED_RELEASE_TABLET | Freq: Once | ORAL | Status: AC
  Administered 2021-04-03: 40 meq via ORAL
  Filled 2021-04-03: qty 2

## 2021-04-03 NOTE — ED Provider Notes (Signed)
[facility]   CSN: [id number] Arrival date & time: 04/03/21  1340     History  Chief Complaint  Patient presents with   Loss of Consciousness    Jasmine Dickerson is a 81 y.o. female.  Patient presents with episode of dizziness.  Occurred today while at home.  She states she felt lightheaded and had to sit down.  Denies passing out.  Denies headache or neck pain or chest pain or abdominal pain.  She is accompanied by her daughter at bedside who states that similar episodes have happened several times in the past typically about once or twice a week for the last several months.  Today the patient had called over one of her neighbors who came and saw the patient and decided to call 911.  Patient states her symptoms have improved and otherwise at this time denies headache chest pain abdominal pain.  No reports of fevers or cough or vomiting or diarrhea.  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      Home Medications Prior to Admission medications   Medication Sig Start Date End Date Taking? Authorizing Provider  amLODipine (NORVASC) 2.5 MG tablet Take 2.5 mg by mouth daily. Patient taking differently takes 1 tablet in the am and 1 tablet in the PM    [provider]  aspirin EC 81 MG tablet Take 81 mg by mouth daily.    [provider]  Calcium Citrate (CITRACAL PO) Take 1,200 mg by mouth. Slow release  Patient takes 1 tablet per day    [provider]  escitalopram (LEXAPRO) 5 MG tablet Take 5 mg by mouth daily.    [provider]  LORazepam (ATIVAN) 0.5 MG tablet Take 0.5 mg by mouth every 8 (eight) hours as needed for anxiety.    [provider]  losartan (COZAAR) 100 MG tablet Take 100 mg by mouth daily.    [provider]  Melatonin 1 MG TABS Take by mouth. 1 tablet in pm    [provider]   rosuvastatin (CRESTOR) 5 MG tablet Take 5 mg by mouth daily at 6 PM.    [provider]  vitamin B-12 (CYANOCOBALAMIN) 1000 MCG tablet Take 1,000 mcg by mouth daily.    [provider]      Allergies    Codeine    Review of Systems   Review of Systems  Constitutional:  Negative for fever.  HENT:  Negative for ear pain.   Eyes:  Negative for pain.  Respiratory:  Negative for cough.   Cardiovascular:  Negative for chest pain.  Gastrointestinal:  Negative for abdominal pain.  Genitourinary:  Negative for flank pain.  Musculoskeletal:  Negative for back pain.  Skin:  Negative for rash.  Neurological:  Negative for headaches.   Physical Exam Updated Vital Signs BP (!) 154/86    Pulse 64    Temp (!) 97.5 F (36.4 C) (Oral)    Resp 18    SpO2 100%  Physical Exam Constitutional:      General: She is not in acute distress.    Appearance: Normal appearance.  HENT:     Head: Normocephalic.     Nose: Nose normal.  Eyes:     Extraocular Movements: Extraocular movements intact.  Cardiovascular:     Rate and Rhythm: Normal rate.  Pulmonary:     Effort: Pulmonary effort is normal.  Musculoskeletal:  General: Normal range of motion.     Cervical back: Normal range of motion.  Neurological:     General: No focal deficit present.     Mental Status: She is alert and oriented to person, place, and time. Mental status is at baseline.     Cranial Nerves: No cranial nerve deficit.     Motor: No weakness.     Gait: Gait normal.    ED Results / Procedures / Treatments   Labs (all labs ordered are listed, but only abnormal results are displayed) Labs Reviewed  BASIC METABOLIC PANEL - Abnormal; Notable for the following components:      Result Value   Potassium 3.1 (*)    Glucose, Bld 100 (*)    All other components within normal limits  CBC WITH DIFFERENTIAL/PLATELET    EKG EKG Interpretation  Date/Time:  Wednesday April 03 2021 13:58:20  EST Ventricular Rate:  59 PR Interval:  193 QRS Duration: 93 QT Interval:  458 QTC Calculation: 454 R Axis:   -2 Text Interpretation: Sinus rhythm Confirmed by Norman Clay ([id number]) on 04/03/2021 2:00:48 PM  Radiology CT Head Wo Contrast  Result Date: 04/03/2021 CLINICAL DATA:  Syncope EXAM: CT HEAD WITHOUT CONTRAST TECHNIQUE: Contiguous axial images were obtained from the base of the skull through the vertex without intravenous contrast. RADIATION DOSE REDUCTION: This exam was performed according to the departmental dose-optimization program which includes automated exposure control, adjustment of the mA and/or kV according to patient size and/or use of iterative reconstruction technique. COMPARISON:  None. FINDINGS: Brain: No acute infarct or hemorrhage. Lateral ventricles and midline structures are unremarkable. No acute extra-axial fluid collections. No mass effect. Vascular: No hyperdense vessel or unexpected calcification. Skull: Normal. Negative for fracture or focal lesion. Sinuses/Orbits: No acute finding. Other: None. IMPRESSION: 1. No acute intracranial process. Electronically Signed   By: Sharlet Salina M.D.   On: 04/03/2021 15:38    Procedures Procedures    Medications Ordered in ED Medications  potassium chloride SA (KLOR-CON M) CR tablet 40 mEq (has no administration in time range)    ED Course/ Medical Decision Making/ A&P                           Medical Decision Making Despite the patient's extensive history, clinically she is well-appearing at this time with no focal neurodeficit.    Amount and/or Complexity of Data Reviewed Independent Historian: caregiver External Data Reviewed: notes.    Details: Patient seen in the outside clinic office March 07, 2021 for eye infections. Labs: ordered. Decision-making details documented in ED Course. Radiology: ordered. Decision-making details documented in ED Course. ECG/medicine tests: ordered and independent  interpretation performed. Decision-making details documented in ED Course.  Risk Prescription drug management. Decision regarding hospitalization. Risk Details: Patient remains asymptomatic now.  I will consider possible additional work-up for syncope/near syncope, however the family states that she had multiple similar work-ups in the past including loop monitor recordings.  She has the symptoms once a week and they prefer to go home.    Patient and family are agreeable to basic labs and brain imaging which were all unremarkable at this time patient remains asymptomatic.  Discharged home in stable condition.  Advising immediate return for worsening symptoms or any additional concerns, otherwise follow-up with her doctor within the week. S           Final Clinical Impression(s) / ED Diagnoses Final diagnoses:  Syncope, unspecified  syncope type    Rx / DC Orders ED Discharge Orders     None         Cheryll CockayneHong, Stevin Bielinski S, MD 04/03/21 [id number]

## 2021-04-03 NOTE — ED Triage Notes (Signed)
Pt bib [facility] from home s/p witnessed syncopal episode.  Pt w/ hx of subarachnoid bleed as well as 2 known aneurisms.  Pt reports frequent syncopal episodes. 12 lead unremarkable per EMS.

## 2021-04-03 NOTE — Discharge Instructions (Addendum)
Your lab tests and CT imaging did not show any emergent findings.  Follow-up with your doctor this week.  Return to the ER if you have worsening symptoms or any additional concerns.

## 2022-10-28 IMAGING — CT CT HEAD W/O CM
3 series · 15 of 47 positions shown, 18 images · non-contrast
Comparison: None.

CLINICAL DATA: Syncope



[Series 2: head wo · axial · 0.44mm/px · z∈[+1444,+1570]mm · 9 of 31 slices shown, 12 images]
[im 3/31  brain]
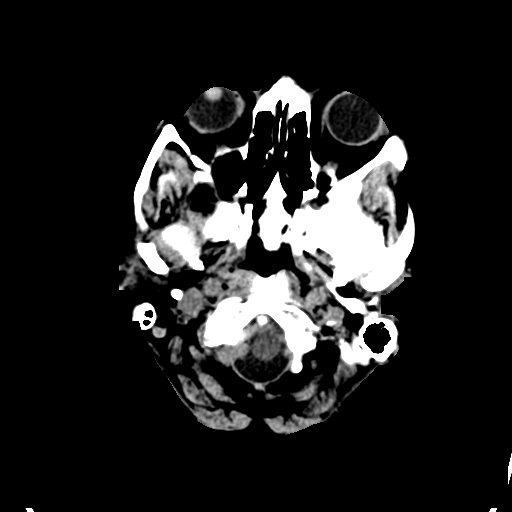
[im 3/31  bone]
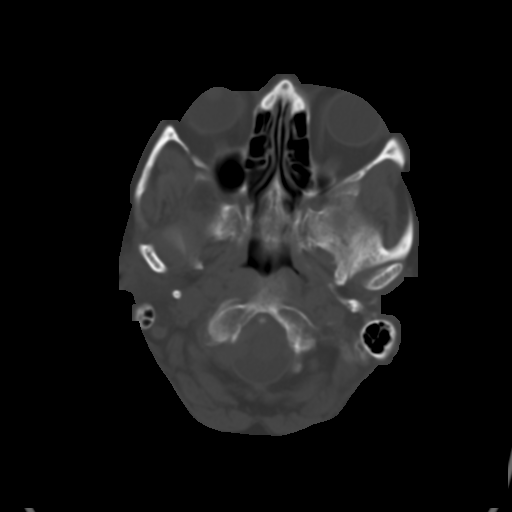
[im 6/31  brain]
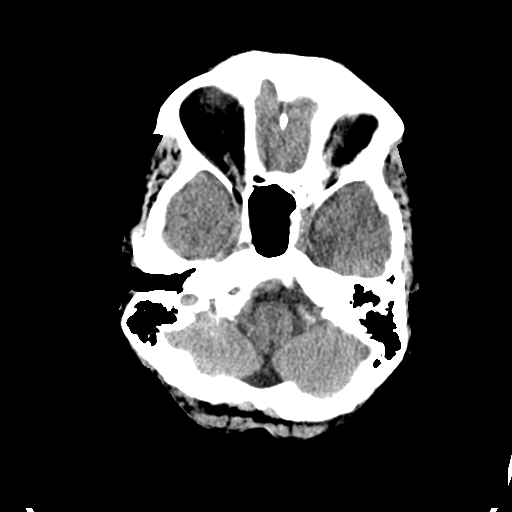
[im 9/31  brain]
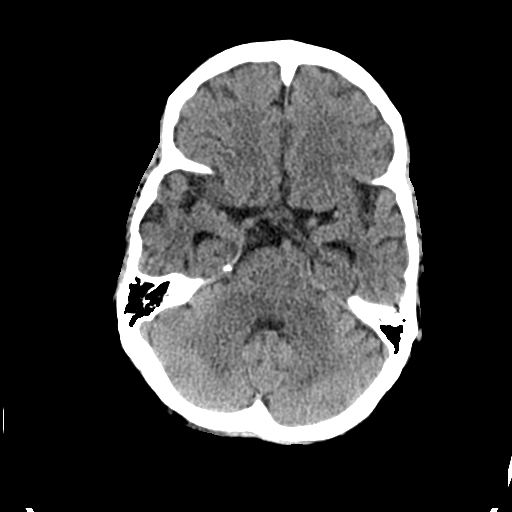
[im 12/31  brain]
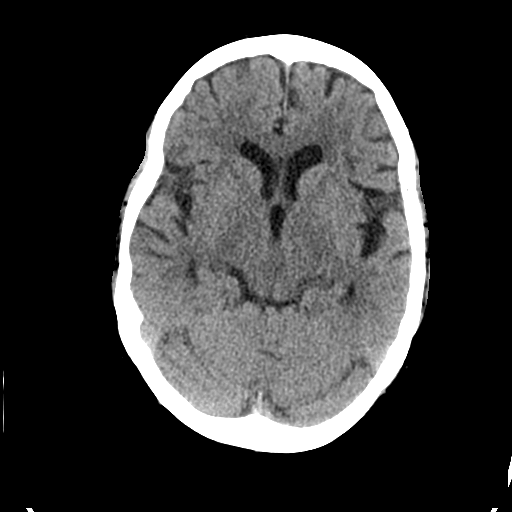
[im 16/31  brain]
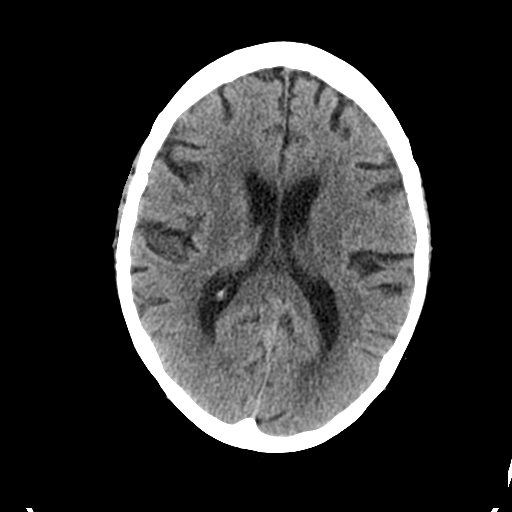
[im 16/31  bone]
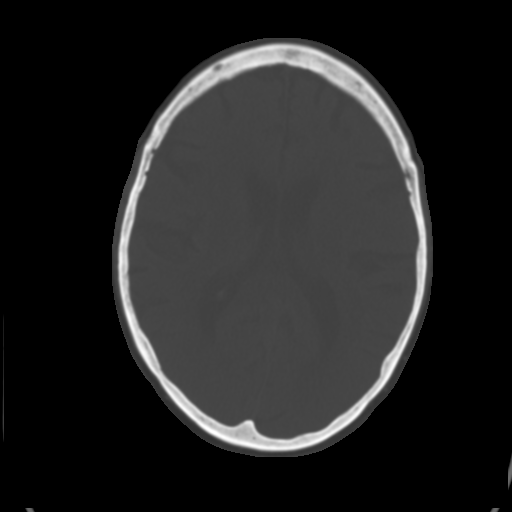
[im 19/31  brain]
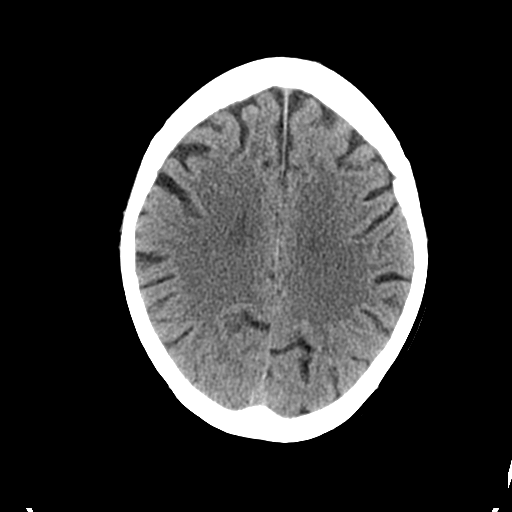
[im 22/31  brain]
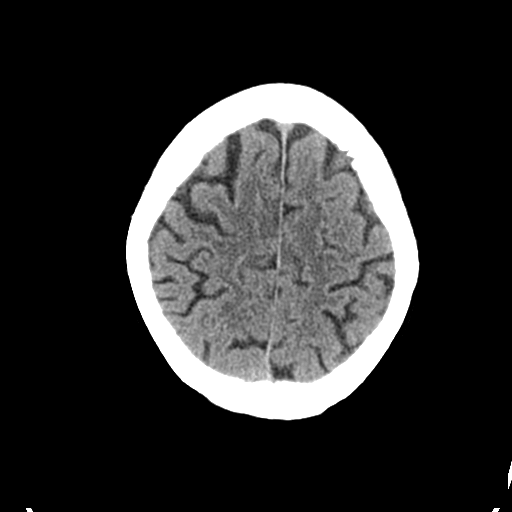
[im 25/31  brain]
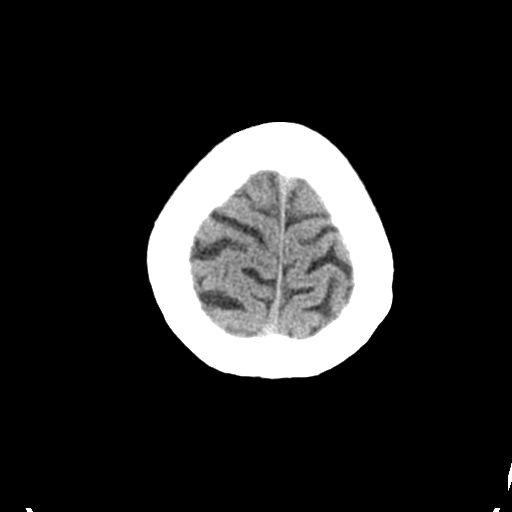
[im 28/31  brain]
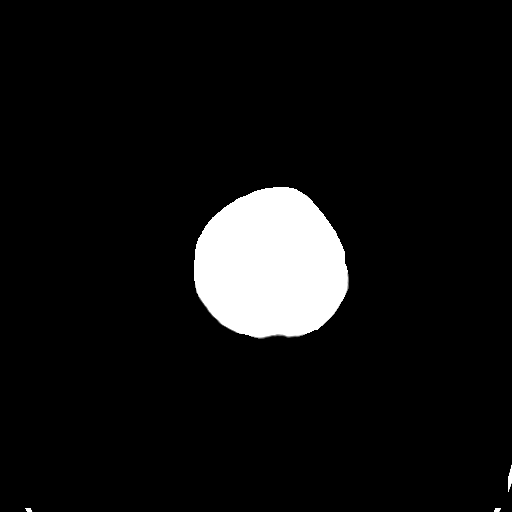
[im 28/31  bone]
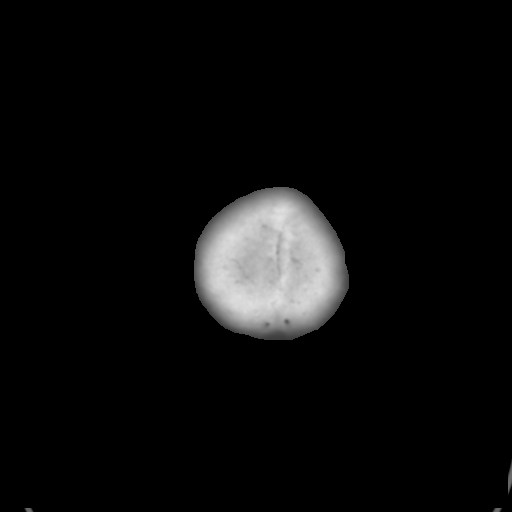

[Series 5: coronal soft tissue · coronal · 0.31mm/px · 3 of 65 slices shown]
[im 22/65  brain]
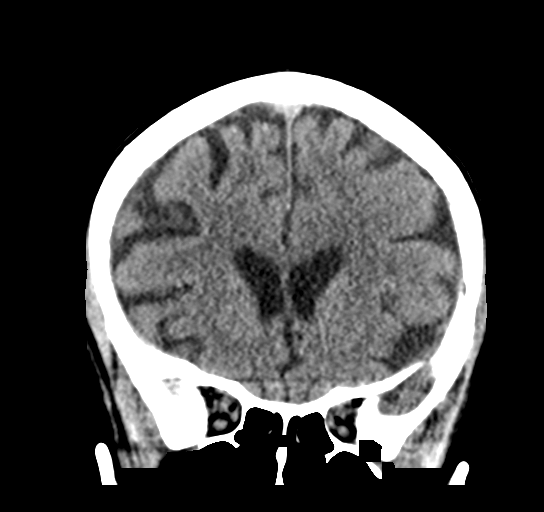
[im 29/65  brain]
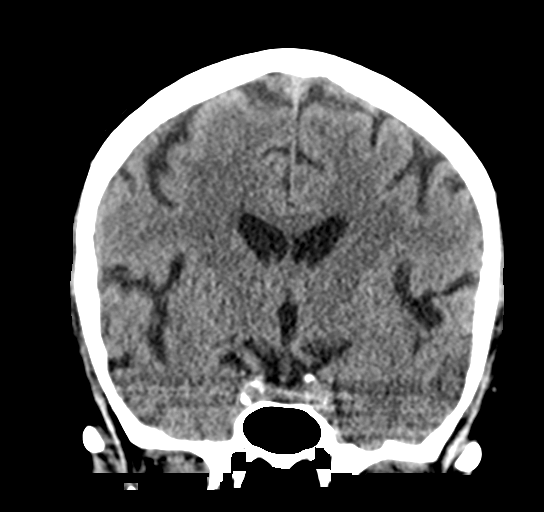
[im 36/65  brain]
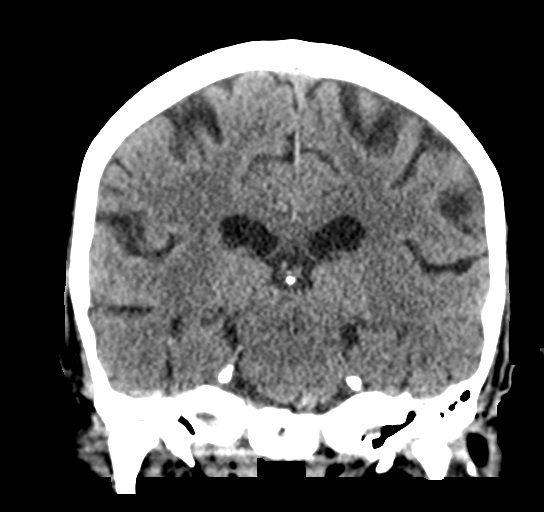

[Series 6: sagittal soft tissue · sagittal · 0.29mm/px · 3 of 58 slices shown]
[im 20/58  brain]
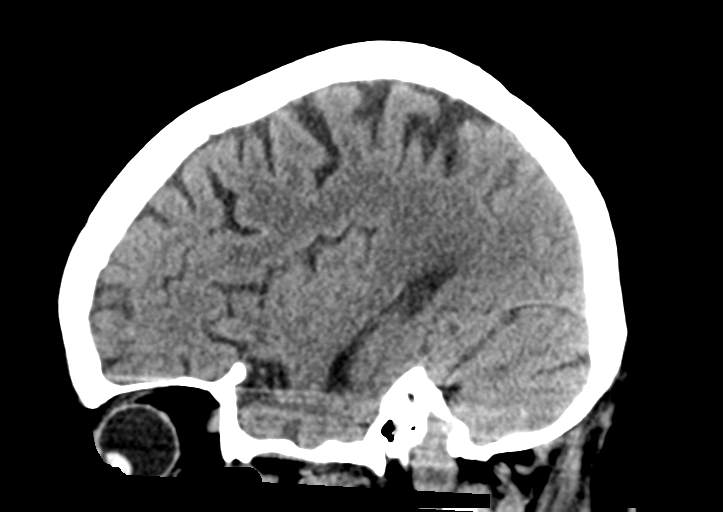
[im 29/58  brain]
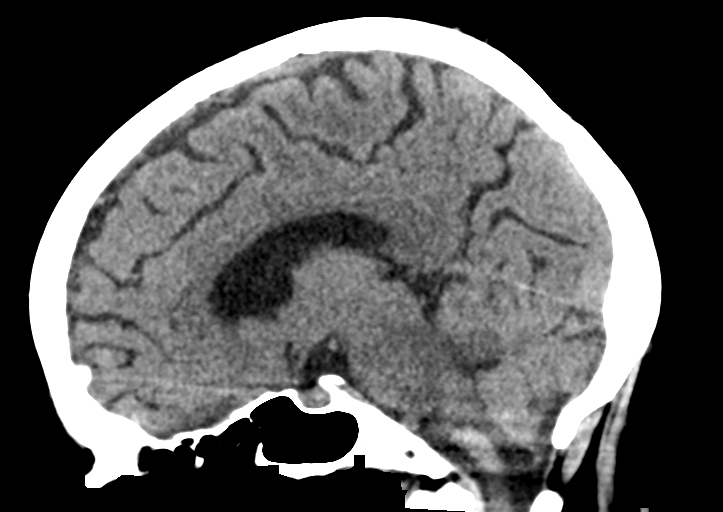
[im 39/58  brain]
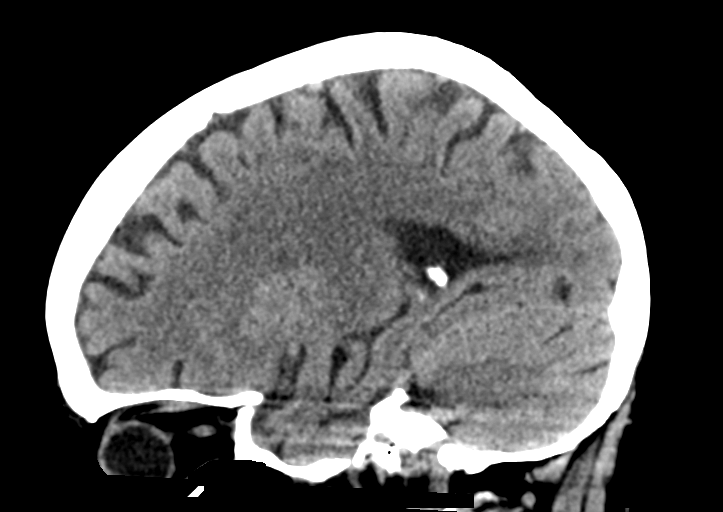

[15 of 47 positions shown; findings below may reference images not displayed]

FINDINGS: Brain: No acute infarct or hemorrhage. Lateral ventricles and
midline structures are unremarkable. No acute extra-axial fluid
collections. No mass effect.

Vascular: No hyperdense vessel or unexpected calcification.

Skull: Normal. Negative for fracture or focal lesion.

Sinuses/Orbits: No acute finding.

Other: None.
IMPRESSION: 1. No acute intracranial process.
